# Patient Record
Sex: Female | Born: 1970 | Race: Asian | Hispanic: No | Marital: Married | Smoking: Never smoker
Health system: Southern US, Community
[De-identification: ages and names within clinical notes are randomized; demographics above are authoritative.]

## PROBLEM LIST (undated history)

## (undated) DIAGNOSIS — E079 Disorder of thyroid, unspecified: Secondary | ICD-10-CM

---

## 2012-12-29 ENCOUNTER — Other Ambulatory Visit (HOSPITAL_COMMUNITY): Payer: Self-pay | Admitting: Endocrinology

## 2012-12-29 DIAGNOSIS — E0590 Thyrotoxicosis, unspecified without thyrotoxic crisis or storm: Secondary | ICD-10-CM

## 2013-01-12 ENCOUNTER — Encounter (HOSPITAL_COMMUNITY)
Admission: RE | Admit: 2013-01-12 | Discharge: 2013-01-12 | Disposition: A | Discharge status: Home / Self Care | Payer: BC Managed Care – PPO | Source: Ambulatory Visit | Attending: Endocrinology | Admitting: Endocrinology

## 2013-01-12 DIAGNOSIS — E0590 Thyrotoxicosis, unspecified without thyrotoxic crisis or storm: Secondary | ICD-10-CM | POA: Insufficient documentation

## 2013-01-13 ENCOUNTER — Encounter (HOSPITAL_COMMUNITY)
Admission: RE | Admit: 2013-01-13 | Discharge: 2013-01-13 | Disposition: A | Discharge status: Home / Self Care | Payer: BC Managed Care – PPO | Source: Ambulatory Visit | Attending: Endocrinology | Admitting: Endocrinology

## 2013-01-13 MED ORDER — SODIUM IODIDE I 131 CAPSULE
15.3000 | Freq: Once | INTRAVENOUS | Status: AC | PRN
  Administered 2013-01-12: 15.3 via ORAL

## 2013-01-13 MED ORDER — SODIUM PERTECHNETATE TC 99M INJECTION
10.9000 | Freq: Once | INTRAVENOUS | Status: AC | PRN
  Administered 2013-01-13: 10.9 via INTRAVENOUS

## 2013-01-31 ENCOUNTER — Other Ambulatory Visit (HOSPITAL_COMMUNITY): Payer: Self-pay | Admitting: Endocrinology

## 2013-01-31 DIAGNOSIS — E0590 Thyrotoxicosis, unspecified without thyrotoxic crisis or storm: Secondary | ICD-10-CM

## 2013-02-06 ENCOUNTER — Encounter (HOSPITAL_COMMUNITY)
Admission: RE | Admit: 2013-02-06 | Discharge: 2013-02-06 | Disposition: A | Discharge status: Home / Self Care | Payer: BC Managed Care – PPO | Source: Ambulatory Visit | Attending: Endocrinology | Admitting: Endocrinology

## 2013-02-06 ENCOUNTER — Other Ambulatory Visit: Payer: Self-pay | Admitting: Nurse Practitioner

## 2013-02-06 DIAGNOSIS — Z1231 Encounter for screening mammogram for malignant neoplasm of breast: Secondary | ICD-10-CM

## 2013-02-06 DIAGNOSIS — E0590 Thyrotoxicosis, unspecified without thyrotoxic crisis or storm: Secondary | ICD-10-CM | POA: Insufficient documentation

## 2013-02-06 MED ORDER — SODIUM IODIDE I 131 CAPSULE
27.0000 | Freq: Once | INTRAVENOUS | Status: AC | PRN
  Administered 2013-02-06: 27 via ORAL

## 2013-09-13 ENCOUNTER — Other Ambulatory Visit (HOSPITAL_COMMUNITY): Payer: Self-pay | Admitting: Endocrinology

## 2013-09-13 DIAGNOSIS — E0590 Thyrotoxicosis, unspecified without thyrotoxic crisis or storm: Secondary | ICD-10-CM

## 2013-09-18 ENCOUNTER — Other Ambulatory Visit: Payer: Self-pay | Admitting: Endocrinology

## 2013-09-18 DIAGNOSIS — E0590 Thyrotoxicosis, unspecified without thyrotoxic crisis or storm: Secondary | ICD-10-CM

## 2013-09-28 ENCOUNTER — Encounter (HOSPITAL_COMMUNITY)
Admission: RE | Admit: 2013-09-28 | Discharge: 2013-09-28 | Disposition: A | Discharge status: Home / Self Care | Payer: BC Managed Care – PPO | Source: Ambulatory Visit | Attending: Endocrinology | Admitting: Endocrinology

## 2013-09-28 ENCOUNTER — Ambulatory Visit (HOSPITAL_COMMUNITY): Payer: BC Managed Care – PPO

## 2013-09-28 DIAGNOSIS — E0590 Thyrotoxicosis, unspecified without thyrotoxic crisis or storm: Secondary | ICD-10-CM | POA: Insufficient documentation

## 2013-09-29 ENCOUNTER — Encounter (HOSPITAL_COMMUNITY)
Admission: RE | Admit: 2013-09-29 | Discharge: 2013-09-29 | Disposition: A | Discharge status: Home / Self Care | Payer: BC Managed Care – PPO | Source: Ambulatory Visit | Attending: Endocrinology | Admitting: Endocrinology

## 2013-10-05 ENCOUNTER — Encounter (HOSPITAL_COMMUNITY): Payer: BC Managed Care – PPO

## 2013-10-06 ENCOUNTER — Encounter (HOSPITAL_COMMUNITY): Payer: BC Managed Care – PPO

## 2013-10-27 ENCOUNTER — Encounter (HOSPITAL_COMMUNITY)
Admission: RE | Admit: 2013-10-27 | Discharge: 2013-10-27 | Disposition: A | Discharge status: Home / Self Care | Payer: BC Managed Care – PPO | Source: Ambulatory Visit | Attending: Endocrinology | Admitting: Endocrinology

## 2013-10-27 DIAGNOSIS — E0590 Thyrotoxicosis, unspecified without thyrotoxic crisis or storm: Secondary | ICD-10-CM | POA: Insufficient documentation

## 2013-10-27 LAB — HCG, SERUM, QUALITATIVE: Preg, Serum: NEGATIVE

## 2013-10-27 MED ORDER — SODIUM IODIDE I 131 CAPSULE
30.0000 | Freq: Once | INTRAVENOUS | Status: AC | PRN
  Administered 2013-10-27: 30.1 via ORAL

## 2014-01-01 ENCOUNTER — Encounter (HOSPITAL_COMMUNITY): Payer: Self-pay | Admitting: Emergency Medicine

## 2014-01-01 DIAGNOSIS — J019 Acute sinusitis, unspecified: Secondary | ICD-10-CM | POA: Insufficient documentation

## 2014-01-01 DIAGNOSIS — R030 Elevated blood-pressure reading, without diagnosis of hypertension: Secondary | ICD-10-CM | POA: Diagnosis not present

## 2014-01-01 DIAGNOSIS — Z79899 Other long term (current) drug therapy: Secondary | ICD-10-CM | POA: Diagnosis not present

## 2014-01-01 DIAGNOSIS — M549 Dorsalgia, unspecified: Secondary | ICD-10-CM | POA: Diagnosis not present

## 2014-01-01 NOTE — ED Notes (Signed)
Pt in stating she thinks she has a sinus infection, also concerned about her BP, states it was high when she checked it at [facility], reports facial pain and headache

## 2014-01-02 ENCOUNTER — Emergency Department (HOSPITAL_COMMUNITY)
Admission: EM | Admit: 2014-01-02 | Discharge: 2014-01-02 | Disposition: A | Discharge status: Home / Self Care | Payer: BC Managed Care – PPO | Attending: Emergency Medicine | Admitting: Emergency Medicine

## 2014-01-02 DIAGNOSIS — IMO0001 Reserved for inherently not codable concepts without codable children: Secondary | ICD-10-CM

## 2014-01-02 DIAGNOSIS — J019 Acute sinusitis, unspecified: Secondary | ICD-10-CM

## 2014-01-02 DIAGNOSIS — R030 Elevated blood-pressure reading, without diagnosis of hypertension: Secondary | ICD-10-CM

## 2014-01-02 HISTORY — DX: Disorder of thyroid, unspecified: E07.9

## 2014-01-02 MED ORDER — HYDROCHLOROTHIAZIDE 12.5 MG PO TABS: 12.5000 mg | ORAL_TABLET | Freq: Every day | ORAL | Status: AC

## 2014-01-02 NOTE — ED Provider Notes (Signed)
CSN: [id number]     Arrival date & time 01/01/14  2047 History   First MD Initiated Contact with Patient 01/02/14 0021     Chief Complaint  Patient presents with  . Hypertension  . Facial Pain     (Consider location/radiation/quality/duration/timing/severity/associated sxs/prior Treatment) HPI Pt is a 43yo female with hx of thyroid disease presenting to ED with c/o facial pain and headache, concerned she has a sinus infection as well as concern for elevated BP as it was high when she checked it earlier today while at [facility].  Pt states she was seen by her PCP on Thursday, 8/20 and started on ammoxicillin x1 week for sinusitis but states she still has a mild diffuse headache with nausea and myalgias. Pt states her BP has been elevated since last week, she attempted to call her PCP on Friday, 8/21 but noone replied.  Pt states this evening BP was 180/"something" BP has also been 170/90s. Denies chest pain or SOB. Denies previous dx of HTN.  Reports taking OTC cold medication however, stopped after receiving the antibiotics. Denies taking pain medication for headahce or body aches. Denies fever chills, n/v/d.   Past Medical History  Diagnosis Date  . Thyroid disease    History reviewed. No pertinent past surgical history. History reviewed. No pertinent family history. History  Substance Use Topics  . Smoking status: Never Smoker   . Smokeless tobacco: Not on file  . Alcohol Use: Not on file   OB History   Grav Para Term Preterm Abortions TAB SAB Ect Mult Living                 Review of Systems  Constitutional: Positive for fever, chills and fatigue.  HENT: Positive for congestion. Negative for sore throat and voice change.   Respiratory: Negative for cough and shortness of breath.   Cardiovascular: Negative for chest pain and palpitations.  Gastrointestinal: Positive for nausea. Negative for vomiting, abdominal pain, diarrhea and constipation.  Musculoskeletal: Positive for back pain  and myalgias. Negative for arthralgias.  Skin: Negative for color change and wound.  Neurological: Positive for weakness ( generalized).  All other systems reviewed and are negative.     Allergies  Review of patient's allergies indicates no known allergies.  Home Medications   Prior to Admission medications   Medication Sig Start Date End Date Taking? Authorizing Provider  hydrochlorothiazide (HYDRODIURIL) 12.5 MG tablet Take 1 tablet (12.5 mg total) by mouth daily. 01/02/14   Junius Finner, PA-C   BP 167/71  Pulse 62  Temp(Src) 97.9 F (36.6 C) (Oral)  Resp 20  SpO2 100% Physical Exam  Nursing note and vitals reviewed. Constitutional: She is oriented to person, place, and time. She appears well-developed and well-nourished. No distress.  HENT:  Head: Normocephalic and atraumatic.  Right Ear: Hearing, tympanic membrane, external ear and ear canal normal.  Left Ear: Hearing, tympanic membrane, external ear and ear canal normal.  Nose: Mucosal edema present. Right sinus exhibits no maxillary sinus tenderness and no frontal sinus tenderness. Left sinus exhibits no maxillary sinus tenderness and no frontal sinus tenderness.  Mouth/Throat: Uvula is midline, oropharynx is clear and moist and mucous membranes are normal.  Eyes: Conjunctivae and EOM are normal. Pupils are equal, round, and reactive to light. No scleral icterus.  Neck: Normal range of motion. Neck supple.  No nuchal rigidity or meningeal signs.  Cardiovascular: Normal rate, regular rhythm and normal heart sounds.   Pulmonary/Chest: Effort normal and breath sounds normal.  No respiratory distress. She has no wheezes. She has no rales. She exhibits no tenderness.  No respiratory distress, able to speak in full sentences w/o difficulty. Lungs: CTAB  Abdominal: Soft. Bowel sounds are normal. She exhibits no distension and no mass. There is no tenderness. There is no rebound and no guarding.  Musculoskeletal: Normal range of  motion.  Neurological: She is alert and oriented to person, place, and time. No cranial nerve deficit. Coordination normal.  Skin: Skin is warm and dry. She is not diaphoretic.    ED Course  Procedures (including critical care time) Labs Review Labs Reviewed - No data to display  Imaging Review No results found.   EKG Interpretation None      MDM   Final diagnoses:  Elevated blood pressure  Acute sinusitis, recurrence not specified, unspecified location   Pt is a 43yo female currently taking ammoxcillin for recently dx sinusitis by her PCP last week. Pt concerned for reports of elevated BP, 180s systolic and 170/90s at times. Denies change in vision or balance, CP or SOB but does c/o headache and myalgias.  Will start pt on HCTZ and provide home care instructions. Strongly encouraged pt to continue antibiotics and advised she may still take acetaminophen and ibuprofen for headache and fever. Encouraged pt to keep BP log to f/u with PCP for recheck of BP. Return precautions provided. Pt verbalized understanding and agreement with tx plan.      Junius Finner, PA-C 01/02/14 [id number]

## 2014-01-02 NOTE — ED Notes (Signed)
Pt verbalized understanding of importance of follow up with PCP regarding BP

## 2014-01-02 NOTE — ED Provider Notes (Signed)
Medical screening examination/treatment/procedure(s) were performed by non-physician practitioner and as supervising physician I was immediately available for consultation/collaboration.   EKG Interpretation None       Alba Perillo, MD 01/02/14 0209 

## 2014-01-02 NOTE — Discharge Instructions (Signed)
You may take 400-600 Ibuprofen (Motrin) every 6-8 hours for fever and pain  Alternate with Tylenol  You may take 500-1000mg  Tylenol every 4-6 hours as needed for fever and pain  Follow-up with your primary care provider next week for recheck of symptoms if not improving.  Be sure to drink plenty of fluids and rest, at least 8hrs of sleep a night, preferably more while you are sick. Return to the ED if you cannot keep down fluids/signs of dehydration, fever not reducing with Tylenol, difficulty breathing/wheezing, stiff neck, worsening condition, or other concerns (see below)
